# Patient Record
Sex: Male | Born: 1970 | Race: Asian | Hispanic: No | Marital: Single | Smoking: Never smoker
Health system: Southern US, Community
[De-identification: ages and names within clinical notes are randomized; demographics above are authoritative.]

## PROBLEM LIST (undated history)

## (undated) DIAGNOSIS — N289 Disorder of kidney and ureter, unspecified: Secondary | ICD-10-CM

---

## 2001-02-27 ENCOUNTER — Emergency Department (HOSPITAL_COMMUNITY): Admission: EM | Admit: 2001-02-27 | Discharge: 2001-02-27 | Payer: Self-pay | Admitting: Emergency Medicine

## 2001-02-28 ENCOUNTER — Encounter: Payer: Self-pay | Admitting: Emergency Medicine

## 2006-08-13 ENCOUNTER — Emergency Department (HOSPITAL_COMMUNITY): Admission: EM | Admit: 2006-08-13 | Discharge: 2006-08-13 | Payer: Self-pay | Admitting: Emergency Medicine

## 2008-09-19 IMAGING — CT CT ABDOMEN W/O CM
2 of 4 series · 16 of 46 positions shown, 18 images · non-contrast
Comparison: none

HISTORY: Right flank pain, vomiting, no history of kidney stones

[Series 2: stone 5.0 b40f · axial · 0.69mm/px · z∈[-522,-68]mm · 13 of 101 slices shown, 15 images]
[im 5/101  soft-tissue]
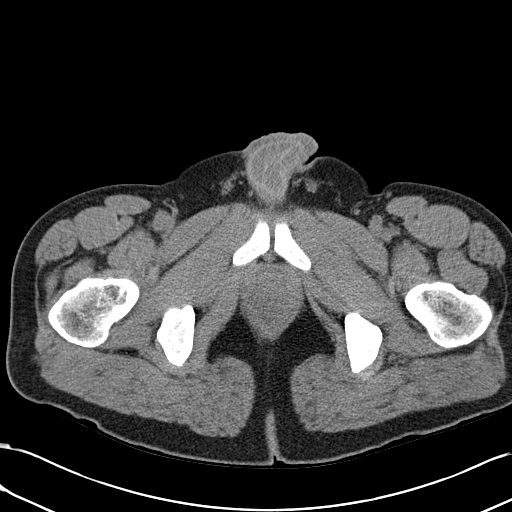
[im 5/101  bone]
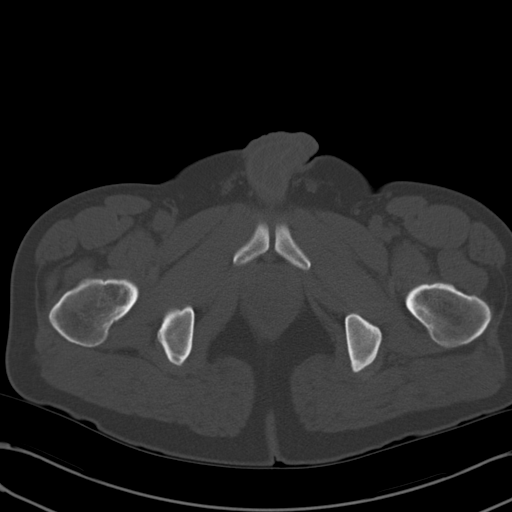
[im 13/101  soft-tissue]
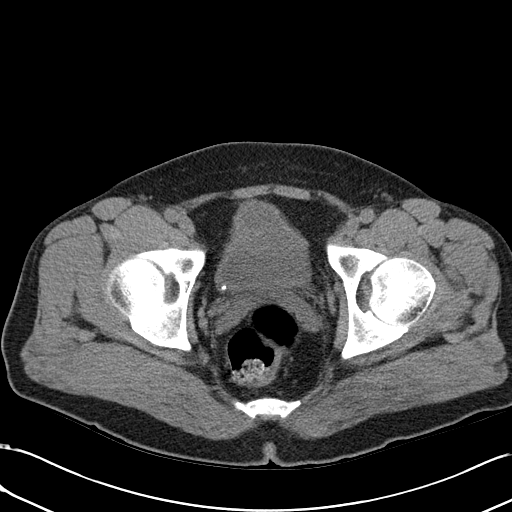
[im 21/101  soft-tissue]
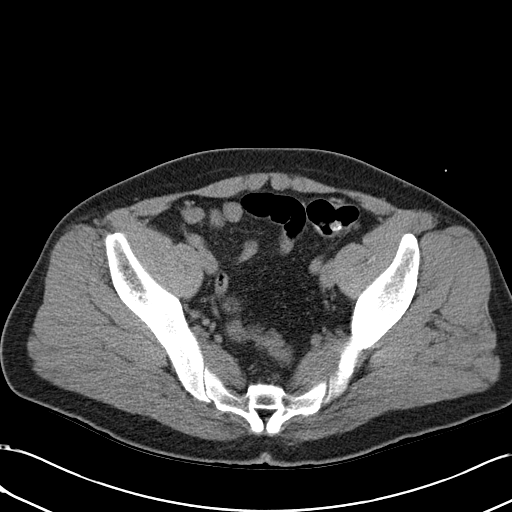
[im 30/101  soft-tissue]
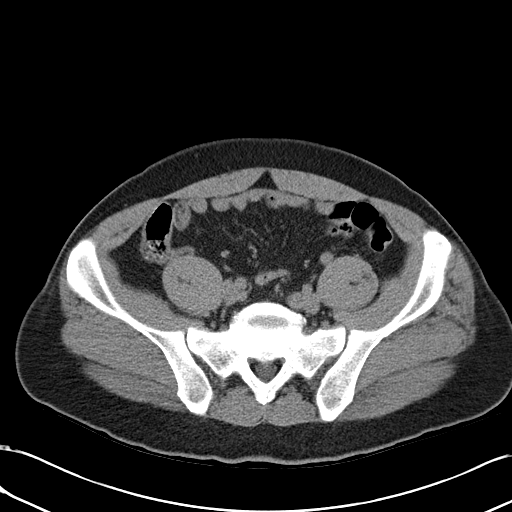
[im 34/101  soft-tissue]
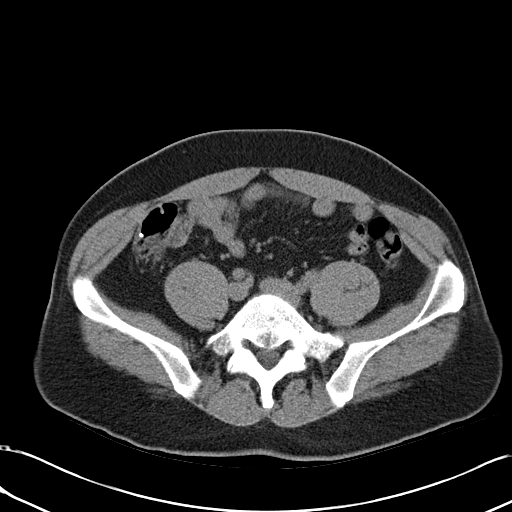
[im 42/101  soft-tissue]
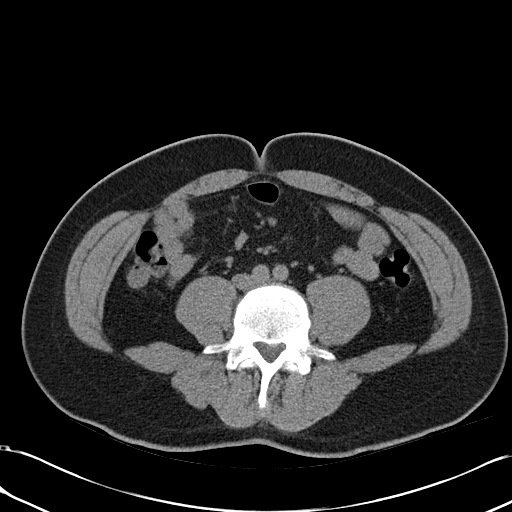
[im 51/101  soft-tissue]
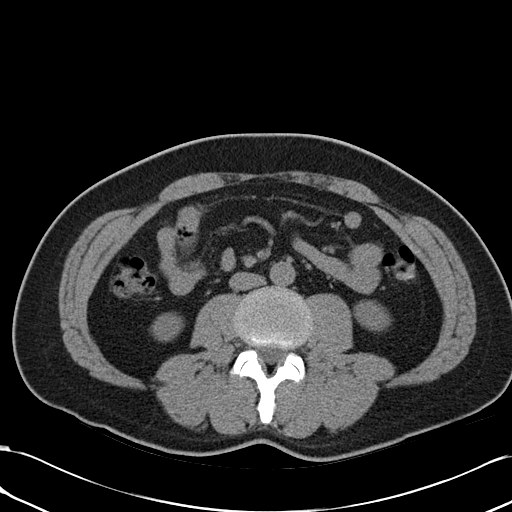
[im 59/101  soft-tissue]
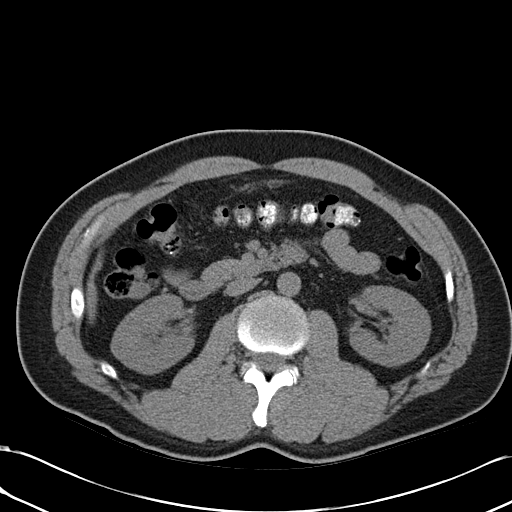
[im 67/101  soft-tissue]
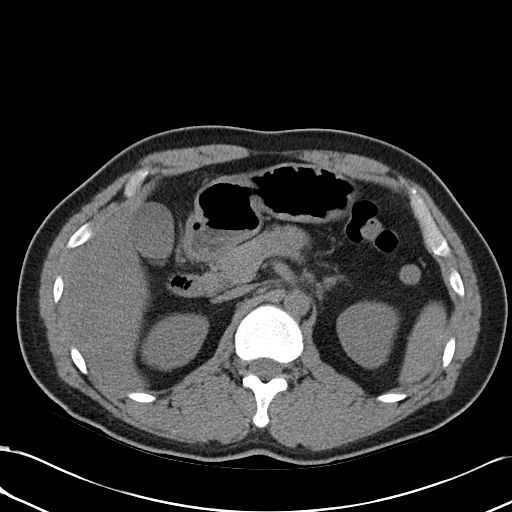
[im 67/101  bone]
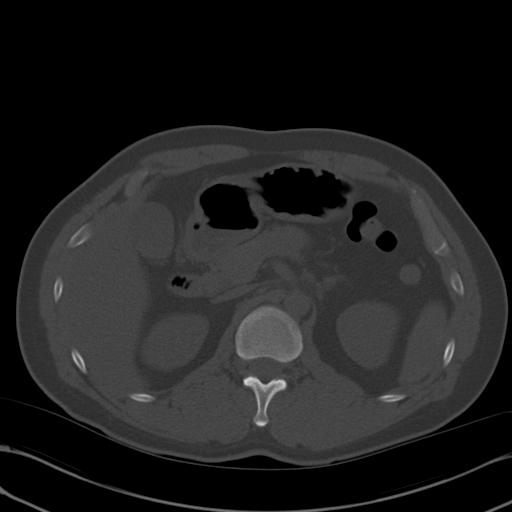
[im 71/101  soft-tissue]
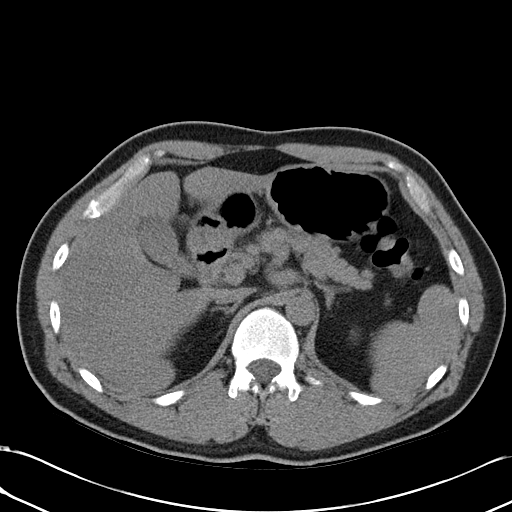
[im 80/101  soft-tissue]
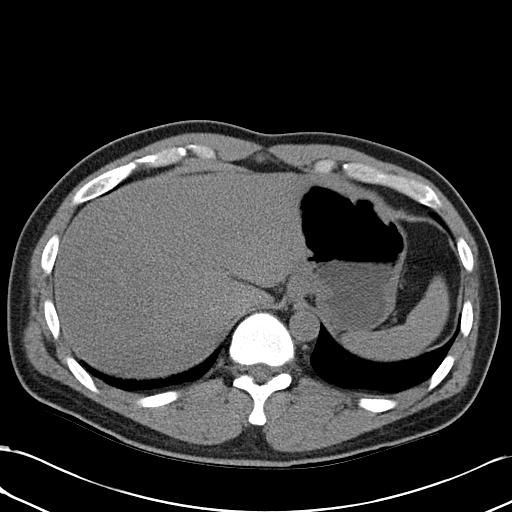
[im 88/101  soft-tissue]
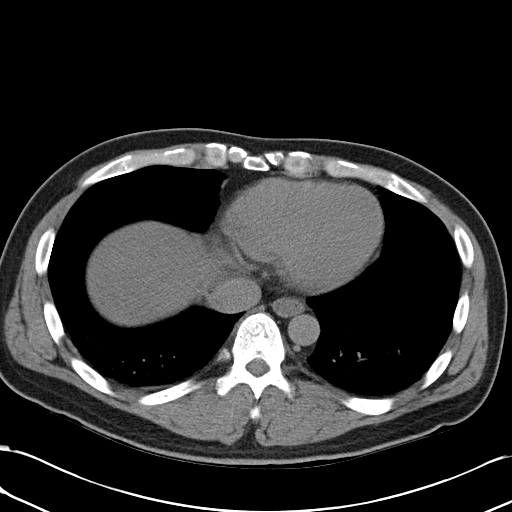
[im 96/101  soft-tissue]
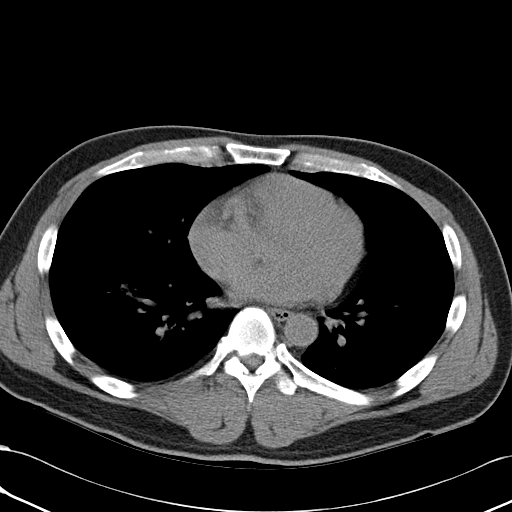

[Series 4: mpr coronal · coronal · 0.73mm/px · 3 of 77 slices shown]
[im 26/77  soft-tissue]
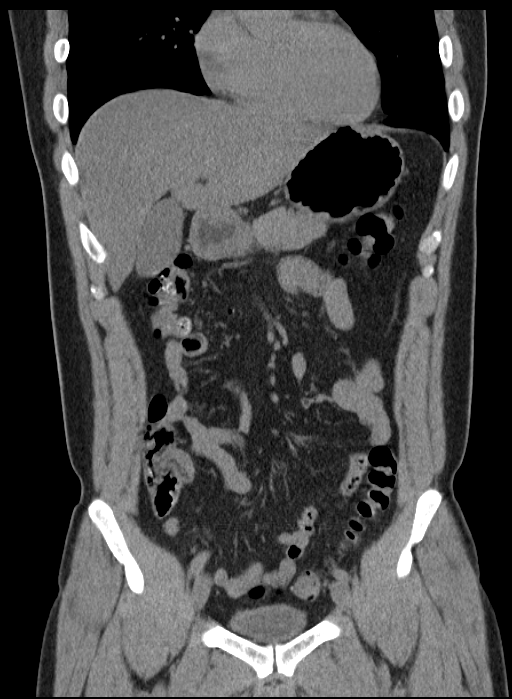
[im 34/77  soft-tissue]
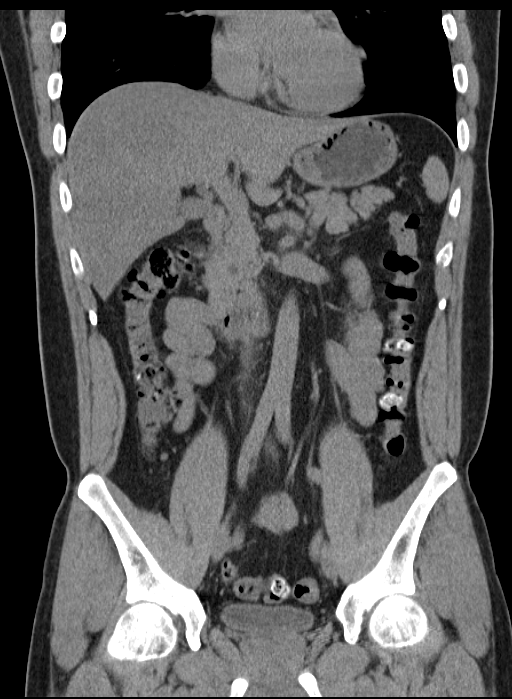
[im 43/77  soft-tissue]
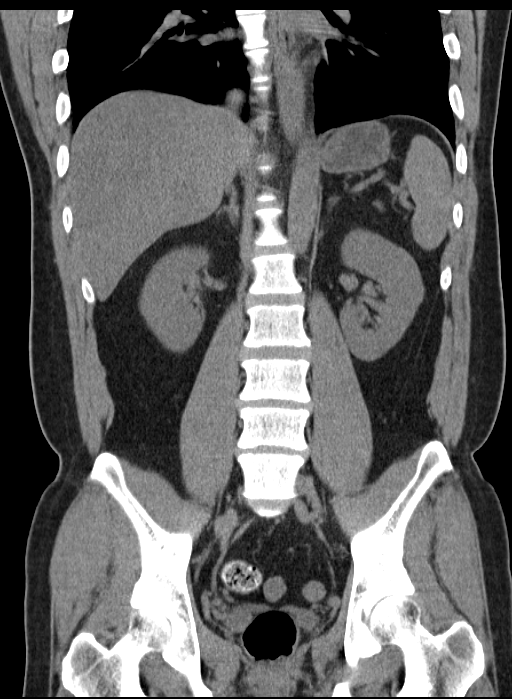

[16 of 46 positions shown; findings below may reference images not displayed]

CT ABDOMEN AND PELVIS WITHOUT CONTRAST:

Multidetector helical CT imaging of abdomen and pelvis performed using kidney
stone protocol. 
Sagittal and coronal images reconstructed from axial data set.
Neither oral nor intravenous contrast utilized for this indication.
No prior exam for comparison.

CT ABDOMEN:

Lung bases clear.
Tiny bilateral renal calculi.
No hydronephrosis or ureteral dilatation.
Remaining solid organs and bowel loops in upper abdomen unremarkable for exam
lacking IV and oral contrast.
No mass, adenopathy, free fluid, or inflammatory process.
IMPRESSION: Tiny bilateral renal calculi.
No definite hydronephrosis or hydroureter, see below.

CT PELVIS:

Small 3 mm calculus identified dependently in urinary bladder at right lateral
aspect compatible with passed stone.
No distal ureteral dilatation or additional calcification seen.
Pelvic bowel loops grossly normal.
Normal appendix.
No pelvic mass, adenopathy, free fluid, or inflammatory process.
IMPRESSION: 3 mm diameter passed calculus dependently in urinary bladder.
Findings called to [REDACTED].

## 2015-05-01 ENCOUNTER — Encounter: Payer: Self-pay | Admitting: *Deleted

## 2015-05-01 DIAGNOSIS — Z0000 Encounter for general adult medical examination without abnormal findings: Secondary | ICD-10-CM

## 2015-05-03 NOTE — Congregational Nurse Program (Unsigned)
Congregational Nurse Program Note  Date of Encounter: 05/01/2015  Past Medical History: No past medical history on file.  Encounter Details: check BP, 153/95, recheck 5 min.later, 120/87 . Will recheck BP next week

## 2016-01-16 ENCOUNTER — Encounter: Payer: Self-pay | Admitting: *Deleted

## 2016-06-19 ENCOUNTER — Telehealth: Payer: Self-pay | Admitting: *Deleted

## 2016-12-16 ENCOUNTER — Encounter: Payer: Self-pay | Admitting: *Deleted

## 2016-12-16 DIAGNOSIS — Z23 Encounter for immunization: Secondary | ICD-10-CM

## 2017-07-29 ENCOUNTER — Encounter (HOSPITAL_COMMUNITY): Payer: Self-pay | Admitting: Emergency Medicine

## 2017-07-29 ENCOUNTER — Emergency Department (HOSPITAL_COMMUNITY)
Admission: EM | Admit: 2017-07-29 | Discharge: 2017-07-29 | Disposition: A | Discharge status: Home / Self Care | Payer: Self-pay | Attending: Emergency Medicine | Admitting: Emergency Medicine

## 2017-07-29 ENCOUNTER — Emergency Department (HOSPITAL_COMMUNITY): Payer: Self-pay

## 2017-07-29 DIAGNOSIS — N200 Calculus of kidney: Secondary | ICD-10-CM | POA: Insufficient documentation

## 2017-07-29 HISTORY — DX: Disorder of kidney and ureter, unspecified: N28.9

## 2017-07-29 LAB — CBC WITH DIFFERENTIAL/PLATELET
Basophils Absolute: 0.1 10*3/uL (ref 0.0–0.1)
Basophils Relative: 0 %
EOS ABS: 0.2 10*3/uL (ref 0.0–0.7)
EOS PCT: 1 %
HCT: 47.9 % (ref 39.0–52.0)
Hemoglobin: 16.3 g/dL (ref 13.0–17.0)
LYMPHS ABS: 1.6 10*3/uL (ref 0.7–4.0)
LYMPHS PCT: 12 %
MCH: 31.2 pg (ref 26.0–34.0)
MCHC: 34 g/dL (ref 30.0–36.0)
MCV: 91.8 fL (ref 78.0–100.0)
MONO ABS: 0.5 10*3/uL (ref 0.1–1.0)
Monocytes Relative: 4 %
Neutro Abs: 10.4 10*3/uL — ABNORMAL HIGH (ref 1.7–7.7)
Neutrophils Relative %: 83 %
PLATELETS: 84 10*3/uL — AB (ref 150–400)
RBC: 5.22 MIL/uL (ref 4.22–5.81)
RDW: 13.2 % (ref 11.5–15.5)
WBC: 12.7 10*3/uL — ABNORMAL HIGH (ref 4.0–10.5)

## 2017-07-29 LAB — URINALYSIS, ROUTINE W REFLEX MICROSCOPIC
Bacteria, UA: NONE SEEN
Bilirubin Urine: NEGATIVE
Glucose, UA: NEGATIVE mg/dL
Ketones, ur: NEGATIVE mg/dL
Leukocytes, UA: NEGATIVE
Nitrite: NEGATIVE
PH: 6 (ref 5.0–8.0)
Protein, ur: NEGATIVE mg/dL
RBC / HPF: >50 RBC/hpf — ABNORMAL HIGH (ref 0–5)
SPECIFIC GRAVITY, URINE: 1.011 (ref 1.005–1.030)

## 2017-07-29 LAB — BASIC METABOLIC PANEL
Anion gap: 10 (ref 5–15)
BUN: 18 mg/dL (ref 6–20)
CHLORIDE: 106 mmol/L (ref 101–111)
CO2: 25 mmol/L (ref 22–32)
CREATININE: 1.11 mg/dL (ref 0.61–1.24)
Calcium: 9 mg/dL (ref 8.9–10.3)
GFR calc Af Amer: >60 mL/min (ref >60)
GFR calc non Af Amer: >60 mL/min (ref >60)
GLUCOSE: 116 mg/dL — AB (ref 65–99)
POTASSIUM: 3.9 mmol/L (ref 3.5–5.1)
SODIUM: 141 mmol/L (ref 135–145)

## 2017-07-29 MED ORDER — ONDANSETRON HCL 4 MG/2ML IJ SOLN
4.0000 mg | Freq: Once | INTRAMUSCULAR | Status: AC
  Administered 2017-07-29: 4 mg via INTRAVENOUS
  Filled 2017-07-29: qty 2

## 2017-07-29 MED ORDER — KETOROLAC TROMETHAMINE 30 MG/ML IJ SOLN
15.0000 mg | Freq: Once | INTRAMUSCULAR | Status: AC
  Administered 2017-07-29: 15 mg via INTRAVENOUS
  Filled 2017-07-29: qty 1

## 2017-07-29 MED ORDER — SODIUM CHLORIDE 0.9 % IV BOLUS
1000.0000 mL | Freq: Once | INTRAVENOUS | Status: AC
  Administered 2017-07-29: 1000 mL via INTRAVENOUS

## 2017-07-29 NOTE — ED Provider Notes (Signed)
[facility] EMERGENCY DEPARTMENT Provider Note   CSN: [id number] Arrival date & time: 07/29/17  1344     History   Chief Complaint Chief Complaint  Patient presents with  . Flank Pain    HPI Dennis Burnett is a 47 y.o. male.  HPI Resents with concern of left flank pain, nausea, vomiting. Onset was yesterday, about 24 hours ago, and initially symptoms were mild, improved, but after symptoms returned, with more severity over the past 4 5 hours, the patient also developed nausea, vomiting, now presents for evaluation. Pain is focally in the left flank, sore, severe, sharp. No abdominal pain, no hematuria, dysuria. No medication taken for pain relief.  Past Medical History:  Diagnosis Date  . Renal disorder    kidney problems    There are no active problems to display for this patient.   History reviewed. No pertinent surgical history.      Home Medications    Prior to Admission medications   Medication Sig Start Date End Date Taking? Authorizing Provider  fexofenadine (ALLEGRA) 180 MG tablet Take 180 mg by mouth daily.   Yes [provider]    Family History History reviewed. No pertinent family history.  Social History Social History   Tobacco Use  . Smoking status: Never Smoker  Substance Use Topics  . Alcohol use: Never    Frequency: Never  . Drug use: Never     Allergies   Patient has no known allergies.   Review of Systems Review of Systems  Constitutional:       Per HPI, otherwise negative  HENT:       Per HPI, otherwise negative  Respiratory:       Per HPI, otherwise negative  Cardiovascular:       Per HPI, otherwise negative  Gastrointestinal: Negative for vomiting.  Endocrine:       Negative aside from HPI  Genitourinary:       Neg aside from HPI   Musculoskeletal:       Per HPI, otherwise negative  Skin: Negative.   Neurological: Negative for syncope.     Physical Exam Updated Vital Signs BP (!) 158/100 (BP Location:  Right Arm)   Pulse 84   Temp (!) 97.1 F (36.2 C) (Oral)   Resp 13   Wt 82.1 kg (181 lb)   SpO2 100%   Physical Exam  Constitutional: He is oriented to person, place, and time. He appears well-developed. No distress.  Uncomfortable appearing male awake and alert, though clearly in pain  HENT:  Head: Normocephalic and atraumatic.  Eyes: Conjunctivae and EOM are normal.  Cardiovascular: Normal rate and regular rhythm.  Pulmonary/Chest: Effort normal. No stridor. No respiratory distress.  Abdominal: He exhibits no distension. There is no tenderness. There is no guarding.  Musculoskeletal: He exhibits no edema.  Neurological: He is alert and oriented to person, place, and time.  Skin: Skin is warm and dry.  Psychiatric: He has a normal mood and affect.  Nursing note and vitals reviewed.    ED Treatments / Results  Labs (all labs ordered are listed, but only abnormal results are displayed) Labs Reviewed  BASIC METABOLIC PANEL - Abnormal; Notable for the following components:      Result Value   Glucose, Bld 116 (*)    All other components within normal limits  CBC WITH DIFFERENTIAL/PLATELET - Abnormal; Notable for the following components:   WBC 12.7 (*)    Platelets 84 (*)  Neutro Abs 10.4 (*)    All other components within normal limits  URINALYSIS, ROUTINE W REFLEX MICROSCOPIC - Abnormal; Notable for the following components:   Hgb urine dipstick LARGE (*)    RBC / HPF >50 (*)    All other components within normal limits    EKG None  Radiology No results found.  Procedures Procedures (including critical care time)  Medications Ordered in ED Medications  sodium chloride 0.9 % bolus 1,000 mL (0 mLs Intravenous Stopped 07/29/17 1527)  ketorolac (TORADOL) 30 MG/ML injection 15 mg (15 mg Intravenous Given 07/29/17 1431)  ondansetron (ZOFRAN) injection 4 mg (4 mg Intravenous Given 07/29/17 1430)     Initial Impression / Assessment and Plan / ED Course  I have  reviewed the triage vital signs and the nursing notes.  Pertinent labs & imaging results that were available during my care of the patient were reviewed by me and considered in my medical decision making (see chart for details).     4:31 PM Patient states that he feels better, is awake, alert, ambulatory. Patient had sensation of stone passage, with an audible sound into the urine specimen cup while providing a sample. His pain is substantially diminished. He earlier deferred CT scan pending lab results, and improvement of his condition. Labs notable for mild leukocytosis, but otherwise reassuring urinalysis, and no evidence for renal dysfunction. Given his substantial improvement, weekly passage of kidney stone, patient is appropriate for discharge with close outpatient follow-up.  Final Clinical Impressions(s) / ED Diagnoses   Final diagnoses:  Kidney stone     Gerhard Munch, MD 07/29/17 [phone]

## 2017-07-29 NOTE — ED Notes (Signed)
Pt is stating he is feeling much better and does not want the CT scan. Will notify Dr. Jeraldine Loots

## 2017-07-29 NOTE — ED Notes (Signed)
Pt is feeling much better and states pain is almost gone.

## 2017-07-29 NOTE — ED Notes (Signed)
Dr. Jeraldine Loots knows pt does not want a CT scan.

## 2017-07-29 NOTE — ED Triage Notes (Signed)
Pt reports left flank pain starting suddenly about midnight.  Pt actively vomiting at triage.

## 2018-01-07 ENCOUNTER — Encounter: Payer: Self-pay | Admitting: *Deleted

## 2018-05-26 ENCOUNTER — Encounter: Payer: Self-pay | Admitting: *Deleted

## 2018-05-26 NOTE — Congregational Nurse Program (Signed)
  Dept: [phone]   Congregational Nurse Program Note  Date of Encounter: 05/25/2018 Past Medical History: Past Medical History:  Diagnosis Date  . Renal disorder    kidney problems    Encounter Details: CNP Questionnaire - 05/25/18 1030      Questionnaire   Patient Status  Immigrant    Race  Asian    Location Patient Served At  Not Applicable   [facility]   Insurance  Private Insurance    Uninsured  Not Applicable    Food  No food insecurities    Housing/Utilities  Yes, have permanent housing    Transportation  No transportation needs    Interpersonal Safety  Yes, feel physically and emotionally safe where you currently live    Medication  No medication insecurities   not taking any meds   Medical Provider  No    Referrals  Not Applicable    ED Visit Averted  Not Applicable    Life-Saving Intervention Made  Not Applicable      passed 2 kidney ston per pt. Checked BP,  Pt knew his BP high, said will loose weight and exercise.  Discuss HTN side effect. Will f/u

## 2019-07-18 ENCOUNTER — Ambulatory Visit: Payer: Self-pay | Attending: Internal Medicine

## 2019-07-18 DIAGNOSIS — Z23 Encounter for immunization: Secondary | ICD-10-CM

## 2019-07-18 NOTE — Progress Notes (Signed)
   Covid-19 Vaccination Clinic  Name:  Dennis Burnett    MRN: [id number] DOB: Aug 02, 1970  07/18/2019  [name] was observed post Covid-19 immunization for 15 minutes without incident. He was provided with Vaccine Information Sheet and instruction to access the V-Safe system.   [name] was instructed to call 911 with any severe reactions post vaccine: Marland Kitchen Difficulty breathing  . Swelling of face and throat  . A fast heartbeat  . A bad rash all over body  . Dizziness and weakness   Immunizations Administered    Name Date Dose VIS Date Route   Pfizer COVID-19 Vaccine 07/18/2019  8:18 AM 0.3 mL 03/13/2019 Intramuscular   Manufacturer: [employer], [employer]   Lot: [id number]   NDC: [address]-1

## 2019-08-11 ENCOUNTER — Ambulatory Visit: Payer: Self-pay | Attending: Internal Medicine

## 2019-08-11 DIAGNOSIS — Z23 Encounter for immunization: Secondary | ICD-10-CM

## 2019-08-11 NOTE — Progress Notes (Signed)
   Covid-19 Vaccination Clinic  Name:  RITHWIK SCHMIEG    MRN: [id number] DOB: 03/10/1971  08/11/2019  Mr. Michel was observed post Covid-19 immunization for 15 minutes without incident. He was provided with Vaccine Information Sheet and instruction to access the V-Safe system.   Mr. Ollinger was instructed to call 911 with any severe reactions post vaccine: Marland Kitchen Difficulty breathing  . Swelling of face and throat  . A fast heartbeat  . A bad rash all over body  . Dizziness and weakness   Immunizations Administered    Name Date Dose VIS Date Route   Pfizer COVID-19 Vaccine 08/11/2019  8:27 AM 0.3 mL 05/27/2018 Intramuscular   Manufacturer: [employer], [employer]   Lot: [id number]   NDC: [address]-2

## 2020-04-24 ENCOUNTER — Telehealth: Payer: Self-pay | Admitting: *Deleted

## 2020-04-24 NOTE — Telephone Encounter (Signed)
C/O cold sx, sore throat and cough, mild fever highest was 38 F. No difficulty breathing. If develop SOB or difficult breathing, go to ER. Pt said sx started last Wed. Whole family were in quarantine now.  Encouraged to drink water and tylenol or Ibuprofen for pain. Will f/u.

## 2020-06-05 ENCOUNTER — Encounter: Payer: Self-pay | Admitting: *Deleted

## 2020-06-05 NOTE — Congregational Nurse Program (Signed)
  Dept: [phone]   Congregational Nurse Program Note  Date of Encounter: 06/05/2020  Past Medical History: Past Medical History:  Diagnosis Date  . Renal disorder    kidney problems    Encounter Details:  CNP Questionnaire - 06/05/20 1026      Questionnaire   Do you give verbal consent to treat you today? Yes    Visit Setting Other   [facility]   Location Patient Served At Not Applicable   [facility]   Patient Status Immigrant    Medical Provider No    Insurance Unknown    Intervention Educate;Assess (including screenings)    Housing/Utilities --   own home   Screening Referrals Diabetic (Hgb A1C, Vision, Podiatry)         wanted check BP and CBG BP ;137/97 HR 77 CBG fasting 123 Diet and exercise recommended also encouraging follow up HgB A1C Mother has DM type2, also father  developped DM after big surgery per pt.

## 2022-01-28 ENCOUNTER — Ambulatory Visit: Payer: Self-pay
